# Patient Record
Sex: Female | Born: 1990 | Race: Black or African American | Hispanic: No | Marital: Single | Smoking: Never smoker
Health system: Southern US, Community
[De-identification: ages and names within clinical notes are randomized; demographics above are authoritative.]

## PROBLEM LIST (undated history)

## (undated) DIAGNOSIS — Z789 Other specified health status: Secondary | ICD-10-CM

## (undated) HISTORY — PX: NO PAST SURGERIES: SHX2092

---

## 2010-09-17 NOTE — L&D Delivery Note (Signed)
Delivery Note At 5:34 PM a viable female was delivered via  (Presentation:LOA ;  ).  APGAR: , ; weight .   Placenta status:Spont , .  Cord:3VC  with the following complications:none .   Anesthesia:  epidural Episiotomy: noneLacerations:  Suture Repair: none Est. Blood Loss400(mL):   Mom to postpartum.  Baby to nursery-stable.  Becky Sharp 06/17/2011, 5:46 PM

## 2010-12-27 LAB — RUBELLA ANTIBODY, IGM: Rubella: IMMUNE

## 2010-12-27 LAB — HEPATITIS B SURFACE ANTIGEN: Hepatitis B Surface Ag: NEGATIVE

## 2010-12-27 LAB — HIV ANTIBODY (ROUTINE TESTING W REFLEX): HIV: NONREACTIVE

## 2010-12-27 LAB — ANTIBODY SCREEN: Antibody Screen: NEGATIVE

## 2010-12-27 LAB — ABO/RH: RH Type: NEGATIVE

## 2011-06-13 ENCOUNTER — Other Ambulatory Visit: Payer: Self-pay | Admitting: Physician Assistant

## 2011-06-15 ENCOUNTER — Telehealth (HOSPITAL_COMMUNITY): Payer: Self-pay | Admitting: *Deleted

## 2011-06-15 ENCOUNTER — Encounter (HOSPITAL_COMMUNITY): Payer: Self-pay | Admitting: *Deleted

## 2011-06-15 NOTE — Telephone Encounter (Signed)
Preadmission screen  

## 2011-06-17 ENCOUNTER — Encounter (HOSPITAL_COMMUNITY): Payer: Self-pay | Admitting: Anesthesiology

## 2011-06-17 ENCOUNTER — Encounter (HOSPITAL_COMMUNITY): Payer: Self-pay | Admitting: *Deleted

## 2011-06-17 ENCOUNTER — Inpatient Hospital Stay (HOSPITAL_COMMUNITY): Payer: Medicaid Other | Admitting: Anesthesiology

## 2011-06-17 ENCOUNTER — Inpatient Hospital Stay (HOSPITAL_COMMUNITY): Payer: Medicaid Other

## 2011-06-17 ENCOUNTER — Inpatient Hospital Stay (HOSPITAL_COMMUNITY)
Admission: AD | Admit: 2011-06-17 | Discharge: 2011-06-19 | DRG: 775 | Disposition: A | Discharge status: Home / Self Care | Payer: Medicaid Other | Source: Ambulatory Visit | Attending: Obstetrics & Gynecology | Admitting: Obstetrics & Gynecology

## 2011-06-17 DIAGNOSIS — O480 Post-term pregnancy: Secondary | ICD-10-CM

## 2011-06-17 HISTORY — DX: Other specified health status: Z78.9

## 2011-06-17 LAB — RPR: RPR Ser Ql: NONREACTIVE

## 2011-06-17 LAB — CBC
MCH: 31.5 pg (ref 26.0–34.0)
MCV: 94 fL (ref 78.0–100.0)
Platelets: 142 10*3/uL — ABNORMAL LOW (ref 150–400)
RBC: 3.49 MIL/uL — ABNORMAL LOW (ref 3.87–5.11)

## 2011-06-17 LAB — URINALYSIS, ROUTINE W REFLEX MICROSCOPIC
Bilirubin Urine: NEGATIVE
Specific Gravity, Urine: <1.005 — ABNORMAL LOW (ref 1.005–1.030)
Urobilinogen, UA: 0.2 mg/dL (ref 0.0–1.0)

## 2011-06-17 LAB — COMPREHENSIVE METABOLIC PANEL
AST: 18 U/L (ref 0–37)
BUN: 11 mg/dL (ref 6–23)
CO2: 22 mEq/L (ref 19–32)
Calcium: 9.7 mg/dL (ref 8.4–10.5)
Creatinine, Ser: 0.7 mg/dL (ref 0.50–1.10)
GFR calc Af Amer: >60 mL/min (ref >60)
GFR calc non Af Amer: >60 mL/min (ref >60)
Glucose, Bld: 90 mg/dL (ref 70–99)

## 2011-06-17 LAB — PROTEIN / CREATININE RATIO, URINE
Creatinine, Urine: 90.47 mg/dL
Protein Creatinine Ratio: 0.13 (ref 0.00–0.15)
Total Protein, Urine: 11.6 mg/dL

## 2011-06-17 MED ORDER — ZOLPIDEM TARTRATE 5 MG PO TABS: 5.0000 mg | ORAL_TABLET | Freq: Every evening | ORAL | Status: DC | PRN

## 2011-06-17 MED ORDER — TETANUS-DIPHTH-ACELL PERTUSSIS 5-2.5-18.5 LF-MCG/0.5 IM SUSP
0.5000 mL | Freq: Once | INTRAMUSCULAR | Status: AC
  Administered 2011-06-18: 0.5 mL via INTRAMUSCULAR
  Filled 2011-06-17: qty 0.5

## 2011-06-17 MED ORDER — PHENYLEPHRINE 40 MCG/ML (10ML) SYRINGE FOR IV PUSH (FOR BLOOD PRESSURE SUPPORT)
80.0000 ug | PREFILLED_SYRINGE | INTRAVENOUS | Status: DC | PRN
  Filled 2011-06-17 (×2): qty 5

## 2011-06-17 MED ORDER — EPHEDRINE 5 MG/ML INJ
10.0000 mg | INTRAVENOUS | Status: DC | PRN
  Filled 2011-06-17: qty 4

## 2011-06-17 MED ORDER — FLEET ENEMA 7-19 GM/118ML RE ENEM: 1.0000 | ENEMA | RECTAL | Status: DC | PRN

## 2011-06-17 MED ORDER — OXYTOCIN 20 UNITS IN LACTATED RINGERS INFUSION - SIMPLE: 125.0000 mL/h | Freq: Once | INTRAVENOUS | Status: DC

## 2011-06-17 MED ORDER — OXYTOCIN BOLUS FROM INFUSION
500.0000 mL | Freq: Once | INTRAVENOUS | Status: DC
  Filled 2011-06-17: qty 500

## 2011-06-17 MED ORDER — SIMETHICONE 80 MG PO CHEW: 80.0000 mg | CHEWABLE_TABLET | ORAL | Status: DC | PRN

## 2011-06-17 MED ORDER — LACTATED RINGERS IV SOLN
500.0000 mL | Freq: Once | INTRAVENOUS | Status: AC
  Administered 2011-06-17: 500 mL via INTRAVENOUS

## 2011-06-17 MED ORDER — LACTATED RINGERS IV SOLN
INTRAVENOUS | Status: DC
  Administered 2011-06-17: 12:00:00 via INTRAVENOUS

## 2011-06-17 MED ORDER — PRENATAL PLUS 27-1 MG PO TABS
1.0000 | ORAL_TABLET | Freq: Every day | ORAL | Status: DC
  Administered 2011-06-18 – 2011-06-19 (×2): 1 via ORAL
  Filled 2011-06-17 (×2): qty 1

## 2011-06-17 MED ORDER — EPHEDRINE 5 MG/ML INJ
10.0000 mg | INTRAVENOUS | Status: DC | PRN
  Filled 2011-06-17 (×2): qty 4

## 2011-06-17 MED ORDER — IBUPROFEN 600 MG PO TABS
600.0000 mg | ORAL_TABLET | Freq: Four times a day (QID) | ORAL | Status: DC
  Administered 2011-06-17 – 2011-06-19 (×8): 600 mg via ORAL
  Filled 2011-06-17 (×8): qty 1

## 2011-06-17 MED ORDER — LIDOCAINE HCL 1.5 % IJ SOLN
INTRAMUSCULAR | Status: DC | PRN
  Administered 2011-06-17 (×2): 5 mL via EPIDURAL

## 2011-06-17 MED ORDER — IBUPROFEN 600 MG PO TABS: 600.0000 mg | ORAL_TABLET | Freq: Four times a day (QID) | ORAL | Status: DC | PRN

## 2011-06-17 MED ORDER — DIPHENHYDRAMINE HCL 25 MG PO CAPS: 25.0000 mg | ORAL_CAPSULE | Freq: Four times a day (QID) | ORAL | Status: DC | PRN

## 2011-06-17 MED ORDER — WITCH HAZEL-GLYCERIN EX PADS: 1.0000 "application " | MEDICATED_PAD | CUTANEOUS | Status: DC | PRN

## 2011-06-17 MED ORDER — DIPHENHYDRAMINE HCL 50 MG/ML IJ SOLN: 12.5000 mg | INTRAMUSCULAR | Status: DC | PRN

## 2011-06-17 MED ORDER — FENTANYL 2.5 MCG/ML BUPIVACAINE 1/10 % EPIDURAL INFUSION (WH - ANES)
14.0000 mL/h | INTRAMUSCULAR | Status: DC
  Administered 2011-06-17: 14 mL/h via EPIDURAL
  Filled 2011-06-17 (×2): qty 60

## 2011-06-17 MED ORDER — MISOPROSTOL 200 MCG PO TABS
ORAL_TABLET | ORAL | Status: AC
  Filled 2011-06-17: qty 5

## 2011-06-17 MED ORDER — FENTANYL 2.5 MCG/ML BUPIVACAINE 1/10 % EPIDURAL INFUSION (WH - ANES)
INTRAMUSCULAR | Status: DC | PRN
  Administered 2011-06-17: 14 mL/h via EPIDURAL

## 2011-06-17 MED ORDER — MISOPROSTOL 200 MCG PO TABS
1000.0000 ug | ORAL_TABLET | Freq: Once | ORAL | Status: AC
  Administered 2011-06-17: 1000 ug via RECTAL

## 2011-06-17 MED ORDER — BENZOCAINE-MENTHOL 20-0.5 % EX AERO: 1.0000 "application " | INHALATION_SPRAY | CUTANEOUS | Status: DC | PRN

## 2011-06-17 MED ORDER — OXYCODONE-ACETAMINOPHEN 5-325 MG PO TABS
1.0000 | ORAL_TABLET | ORAL | Status: DC | PRN
  Administered 2011-06-18 – 2011-06-19 (×2): 1 via ORAL
  Filled 2011-06-17 (×2): qty 1

## 2011-06-17 MED ORDER — TERBUTALINE SULFATE 1 MG/ML IJ SOLN: 0.2500 mg | Freq: Once | INTRAMUSCULAR | Status: DC | PRN

## 2011-06-17 MED ORDER — OXYTOCIN 20 UNITS IN LACTATED RINGERS INFUSION - SIMPLE
1.0000 m[IU]/min | INTRAVENOUS | Status: DC
  Administered 2011-06-17: 2 m[IU]/min via INTRAVENOUS
  Filled 2011-06-17: qty 1000

## 2011-06-17 MED ORDER — CITRIC ACID-SODIUM CITRATE 334-500 MG/5ML PO SOLN: 30.0000 mL | ORAL | Status: DC | PRN

## 2011-06-17 MED ORDER — PHENYLEPHRINE 40 MCG/ML (10ML) SYRINGE FOR IV PUSH (FOR BLOOD PRESSURE SUPPORT)
80.0000 ug | PREFILLED_SYRINGE | INTRAVENOUS | Status: DC | PRN
  Filled 2011-06-17: qty 5

## 2011-06-17 MED ORDER — ONDANSETRON HCL 4 MG/2ML IJ SOLN: 4.0000 mg | INTRAMUSCULAR | Status: DC | PRN

## 2011-06-17 MED ORDER — LACTATED RINGERS IV SOLN
500.0000 mL | INTRAVENOUS | Status: DC | PRN
  Administered 2011-06-17: 1000 mL via INTRAVENOUS

## 2011-06-17 MED ORDER — ONDANSETRON HCL 4 MG PO TABS: 4.0000 mg | ORAL_TABLET | ORAL | Status: DC | PRN

## 2011-06-17 MED ORDER — LANOLIN HYDROUS EX OINT: TOPICAL_OINTMENT | CUTANEOUS | Status: DC | PRN

## 2011-06-17 MED ORDER — OXYCODONE-ACETAMINOPHEN 5-325 MG PO TABS: 2.0000 | ORAL_TABLET | ORAL | Status: DC | PRN

## 2011-06-17 MED ORDER — DIBUCAINE 1 % RE OINT: 1.0000 "application " | TOPICAL_OINTMENT | RECTAL | Status: DC | PRN

## 2011-06-17 MED ORDER — ONDANSETRON HCL 4 MG/2ML IJ SOLN: 4.0000 mg | Freq: Four times a day (QID) | INTRAMUSCULAR | Status: DC | PRN

## 2011-06-17 MED ORDER — LIDOCAINE HCL (PF) 1 % IJ SOLN: 30.0000 mL | INTRAMUSCULAR | Status: DC | PRN

## 2011-06-17 MED ORDER — SENNOSIDES-DOCUSATE SODIUM 8.6-50 MG PO TABS
2.0000 | ORAL_TABLET | Freq: Every day | ORAL | Status: DC
  Administered 2011-06-18: 2 via ORAL

## 2011-06-17 MED ORDER — ACETAMINOPHEN 325 MG PO TABS: 650.0000 mg | ORAL_TABLET | ORAL | Status: DC | PRN

## 2011-06-17 NOTE — Anesthesia Postprocedure Evaluation (Signed)
Anesthesia Post Note  Patient: Becky Sharp  Procedure(s) Performed: * No procedures listed *  Anesthesia type: Epidural  Patient location: Mother/Baby  Post pain: Pain level controlled  Post assessment: Post-op Vital signs reviewed  Last Vitals:  Filed Vitals:   06/17/11 1816  BP: 145/80  Pulse: 104  Temp:   Resp:     Post vital signs: Reviewed  Level of consciousness: awake  Complications: No apparent anesthesia complications

## 2011-06-17 NOTE — Anesthesia Procedure Notes (Signed)
Epidural Patient location during procedure: OB Start time: 06/17/2011 11:55 AM End time: 06/17/2011 12:02 PM Reason for block: procedure for pain  Staffing Anesthesiologist: Sandrea Hughs Performed by: anesthesiologist   Preanesthetic Checklist Completed: patient identified, site marked, surgical consent, pre-op evaluation, timeout performed, IV checked, risks and benefits discussed and monitors and equipment checked  Epidural Patient position: sitting Prep: site prepped and draped and DuraPrep Patient monitoring: continuous pulse ox and blood pressure Approach: midline Injection technique: LOR air  Needle:  Needle type: Tuohy  Needle gauge: 17 G Needle length: 9 cm Needle insertion depth: 5 cm cm Catheter type: closed end flexible Catheter size: 19 Gauge Catheter at skin depth: 10 cm Test dose: negative and 1.5% lidocaine  Assessment Sensory level: T8 Events: blood not aspirated, injection not painful, no injection resistance, negative IV test and no paresthesia

## 2011-06-17 NOTE — H&P (Signed)
Becky Sharp is a 20 y.o. G1P0 female at [redacted]w[redacted]d presenting for contractions since midnight.  Every 3-4 minutes, rated a 6/10.  Also leaking brownish liquid since just after midnight.  Denies vaginal bleeding.  Reports good fetal movement.  Prenatal care at [facility].  Scheduled for induction on Wednesday. Maternal Medical History:  Reason for admission: Reason for admission: contractions.  Reason for Admission:   nauseaContractions: Onset was 3-5 hours ago.   Frequency: regular.   Duration is approximately 60 seconds.   Perceived severity is moderate.    Fetal activity: Perceived fetal activity is normal.   Last perceived fetal movement was within the past hour.    Prenatal complications: no prenatal complications Prenatal Complications - Diabetes: none.    OB History    Grav Para Term Preterm Abortions TAB SAB Ect Mult Living   1              Past Medical History  Diagnosis Date  . No pertinent past medical history    Past Surgical History  Procedure Date  . No past surgeries    Family History: family history includes Arthritis in her maternal grandmother and paternal grandmother; Asthma in her paternal grandfather; Diabetes in her maternal grandmother, paternal grandfather, and paternal grandmother; Heart disease in her paternal grandfather; Hyperlipidemia in her paternal grandmother; Hypertension in her mother; Stroke in her paternal grandfather; and Vision loss in her maternal grandmother. Social History:  reports that she has never smoked. She does not have any smokeless tobacco history on file. She reports that she does not drink alcohol or use illicit drugs.  No Known Allergies  No current facility-administered medications on file prior to encounter.   No current outpatient prescriptions on file prior to encounter.  Prenatals  Review of Systems  Constitutional: Negative for fever and chills.  Eyes: Negative for blurred vision.  Respiratory: Negative for shortness of breath.    Cardiovascular: Negative for palpitations.  Gastrointestinal: Negative for nausea, vomiting and diarrhea.  Genitourinary: Negative for dysuria.  Skin: Negative for rash.  Neurological: Negative for dizziness, tingling and headaches.    Dilation: 3 Effacement (%): 50 Station: -2 Exam by:: Dr Elwyn Reach Blood pressure 126/93, pulse 93, temperature 98.6 F (37 C), temperature source Oral, resp. rate 20, last menstrual period 09/03/2010. Maternal Exam:  Uterine Assessment: Contraction duration is 70 seconds. Contraction frequency is regular.  Contractions every 2-5 minutes  Abdomen: Patient reports no abdominal tenderness. Fundal height is consistent with dates.   Estimated fetal weight is 7.5lb.   Fetal presentation: vertex  Introitus: Normal vulva. Normal vagina.  Ferning test: negative.   Pelvis: adequate for delivery.   Cervix: Cervix evaluated by digital exam.     Fetal Exam Fetal Monitor Review: Mode: ultrasound.   Baseline rate: 145.  Variability: minimal (<5 bpm).   Pattern: accelerations present and no decelerations.    Fetal State Assessment: Category II - tracings are indeterminate.     Physical Exam  Constitutional: She is oriented to person, place, and time. She appears well-developed and well-nourished. No distress.  HENT:  Head: Normocephalic and atraumatic.  Mouth/Throat: Oropharynx is clear and moist.  Eyes: No scleral icterus.  Neck: Normal range of motion.  Respiratory: Effort normal.  GI:       gravid  Musculoskeletal: She exhibits no edema and no tenderness.  Neurological: She is alert and oriented to person, place, and time.  Skin: Skin is warm and dry. No rash noted.  Pelvic: normal external genitalia.  Normal vagina.  Pooling positive with brownish fluid.  Filed Vitals:   06/17/11 0322  BP: 146/92  Pulse: 93  Temp:   Resp:      Prenatal labs: ABO, Rh: A/Negative/-- (04/11 0000) Antibody: Negative (04/11 0000) Rubella: Immune (04/11  0000) RPR: Nonreactive (04/11 0000)  HBsAg: Negative (04/11 0000)  HIV: Non-reactive (04/11 0000)  GBS: Negative (09/06 0000)   Assessment/Plan: 20 year old G1 at 41 weeks presenting for labor evaluation. Rh neg, rubella immune, GBS neg Fetal wellbeing: moderate variability after crackers and sprite; no accels  Will walk for an hour then recheck. Cervical exam unchanged, however BPs borderline and fetus not reactive.  Will get PIH labs and BPP.   CBC    Component Value Date/Time   WBC 10.3 06/17/2011 0401   RBC 3.49* 06/17/2011 0401   HGB 11.0* 06/17/2011 0401   HCT 32.8* 06/17/2011 0401   PLT 142* 06/17/2011 0401   MCV 94.0 06/17/2011 0401   MCH 31.5 06/17/2011 0401   MCHC 33.5 06/17/2011 0401   RDW 13.4 06/17/2011 0401   CMP     Component Value Date/Time   NA 135 06/17/2011 0401   K 3.9 06/17/2011 0401   CL 104 06/17/2011 0401   CO2 22 06/17/2011 0401   GLUCOSE 90 06/17/2011 0401   BUN 11 06/17/2011 0401   CREATININE 0.70 06/17/2011 0401   CALCIUM 9.7 06/17/2011 0401   PROT 6.3 06/17/2011 0401   ALBUMIN 2.7* 06/17/2011 0401   AST 18 06/17/2011 0401   ALT 10 06/17/2011 0401   ALKPHOS 225* 06/17/2011 0401   BILITOT 0.3 06/17/2011 0401   GFRNONAA >60 06/17/2011 0401   GFRAA >60 06/17/2011 0401   Urine dipstick shows positive for leukocytes, negative for protein.  Micro exam: few squames, few bacteria Urine Pr/Cr: pending   BPP: 8/8, subjectively low AFI, AFI 8.11cm (16%ile)  Amnisure: negative Dilation: 3.5 Effacement (%): 70 Cervical Position: Middle Station: -3 Presentation: Vertex Exam by:: Dr Elwyn Reach  Will admit to labor and delivery as is making some change, has low-normal AFI with non-reactive strip, and is scheduled for induction in 3 days.  BOOTH, Munir Victorian 06/17/2011, 2:46 AM

## 2011-06-17 NOTE — ED Notes (Signed)
Dr Elwyn Reach notified lab and u/s results back and will see pt

## 2011-06-17 NOTE — ED Notes (Signed)
0235 Sprite and crackers to pt per request Dr Elwyn Reach

## 2011-06-17 NOTE — ED Notes (Signed)
Dr Elwyn Reach notified of pt's admission and status. Will see pt

## 2011-06-17 NOTE — ED Provider Notes (Signed)
Agree with note above. MUHAMMAD,Chanie Soucek 

## 2011-06-17 NOTE — Anesthesia Preprocedure Evaluation (Signed)
Anesthesia Evaluation  Name, MR# and DOB Patient awake  General Assessment Comment  Reviewed: Allergy & Precautions, H&P , Patient's Chart, lab work & pertinent test results  Airway Mallampati: I TM Distance: >3 FB Neck ROM: full    Dental No notable dental hx.    Pulmonary  clear to auscultation  Pulmonary exam normal       Cardiovascular     Neuro/Psych Negative Neurological ROS  Negative Psych ROS   GI/Hepatic negative GI ROS Neg liver ROS    Endo/Other  Negative Endocrine ROS  Renal/GU negative Renal ROS  Genitourinary negative   Musculoskeletal negative musculoskeletal ROS (+)   Abdominal Normal abdominal exam  (+)   Peds  Hematology negative hematology ROS (+)   Anesthesia Other Findings   Reproductive/Obstetrics (+) Pregnancy                           Anesthesia Physical Anesthesia Plan  ASA: II  Anesthesia Plan: Epidural   Post-op Pain Management:    Induction:   Airway Management Planned:   Additional Equipment:   Intra-op Plan:   Post-operative Plan:   Informed Consent: I have reviewed the patients History and Physical, chart, labs and discussed the procedure including the risks, benefits and alternatives for the proposed anesthesia with the patient or authorized representative who has indicated his/her understanding and acceptance.     Plan Discussed with:   Anesthesia Plan Comments:         Anesthesia Quick Evaluation

## 2011-06-17 NOTE — ED Provider Notes (Signed)
Becky Sharp is a 20 y.o. G1P0 female at [redacted]w[redacted]d presenting for contractions since midnight.  Every 3-4 minutes, rated a 6/10.  Also leaking brownish liquid since just after midnight.  Denies vaginal bleeding.  Reports good fetal movement.  Prenatal care at [facility].  Scheduled for induction on Wednesday. Maternal Medical History:  Reason for admission: Reason for admission: contractions.  Reason for Admission:   nauseaContractions: Onset was 3-5 hours ago.   Frequency: regular.   Duration is approximately 60 seconds.   Perceived severity is moderate.    Fetal activity: Perceived fetal activity is normal.   Last perceived fetal movement was within the past hour.    Prenatal complications: no prenatal complications Prenatal Complications - Diabetes: none.    OB History    Grav Para Term Preterm Abortions TAB SAB Ect Mult Living   1              Past Medical History  Diagnosis Date  . No pertinent past medical history    Past Surgical History  Procedure Date  . No past surgeries    Family History: family history includes Arthritis in her maternal grandmother and paternal grandmother; Asthma in her paternal grandfather; Diabetes in her maternal grandmother, paternal grandfather, and paternal grandmother; Heart disease in her paternal grandfather; Hyperlipidemia in her paternal grandmother; Hypertension in her mother; Stroke in her paternal grandfather; and Vision loss in her maternal grandmother. Social History:  reports that she has never smoked. She does not have any smokeless tobacco history on file. She reports that she does not drink alcohol or use illicit drugs.  No Known Allergies  No current facility-administered medications on file prior to encounter.   No current outpatient prescriptions on file prior to encounter.  Prenatals  Review of Systems  Constitutional: Negative for fever and chills.  Eyes: Negative for blurred vision.  Respiratory: Negative for shortness of breath.     Cardiovascular: Negative for palpitations.  Gastrointestinal: Negative for nausea, vomiting and diarrhea.  Genitourinary: Negative for dysuria.  Skin: Negative for rash.  Neurological: Negative for dizziness, tingling and headaches.    Dilation: 3 Effacement (%): 50 Station: -2 Exam by:: Dr Elwyn Reach Blood pressure 126/93, pulse 93, temperature 98.6 F (37 C), temperature source Oral, resp. rate 20, last menstrual period 09/03/2010. Maternal Exam:  Uterine Assessment: Contraction duration is 70 seconds. Contraction frequency is regular.  Contractions every 2-5 minutes  Abdomen: Patient reports no abdominal tenderness. Fundal height is consistent with dates.   Estimated fetal weight is 7.5lb.   Fetal presentation: vertex  Introitus: Normal vulva. Normal vagina.  Ferning test: negative.   Pelvis: adequate for delivery.   Cervix: Cervix evaluated by digital exam.     Fetal Exam Fetal Monitor Review: Mode: ultrasound.   Baseline rate: 145.  Variability: minimal (<5 bpm).   Pattern: accelerations present and no decelerations.    Fetal State Assessment: Category II - tracings are indeterminate.     Physical Exam  Constitutional: She is oriented to person, place, and time. She appears well-developed and well-nourished. No distress.  HENT:  Head: Normocephalic and atraumatic.  Mouth/Throat: Oropharynx is clear and moist.  Eyes: No scleral icterus.  Neck: Normal range of motion.  Respiratory: Effort normal.  GI:       gravid  Musculoskeletal: She exhibits no edema and no tenderness.  Neurological: She is alert and oriented to person, place, and time.  Skin: Skin is warm and dry. No rash noted.  Pelvic: normal external genitalia.  Normal vagina.  Pooling positive with brownish fluid.  Filed Vitals:   06/17/11 0322  BP: 146/92  Pulse: 93  Temp:   Resp:      Prenatal labs: ABO, Rh: A/Negative/-- (04/11 0000) Antibody: Negative (04/11 0000) Rubella: Immune (04/11  0000) RPR: Nonreactive (04/11 0000)  HBsAg: Negative (04/11 0000)  HIV: Non-reactive (04/11 0000)  GBS: Negative (09/06 0000)   Assessment/Plan: 20 year old G1 at 41 weeks presenting for labor evaluation. Rh neg, rubella immune, GBS neg Fetal wellbeing: moderate variability after crackers and sprite; no accels  Will walk for an hour then recheck. Cervical exam unchanged, however BPs borderline and fetus not reactive.  Will get PIH labs and BPP.   CBC    Component Value Date/Time   WBC 10.3 06/17/2011 0401   RBC 3.49* 06/17/2011 0401   HGB 11.0* 06/17/2011 0401   HCT 32.8* 06/17/2011 0401   PLT 142* 06/17/2011 0401   MCV 94.0 06/17/2011 0401   MCH 31.5 06/17/2011 0401   MCHC 33.5 06/17/2011 0401   RDW 13.4 06/17/2011 0401   CMP     Component Value Date/Time   NA 135 06/17/2011 0401   K 3.9 06/17/2011 0401   CL 104 06/17/2011 0401   CO2 22 06/17/2011 0401   GLUCOSE 90 06/17/2011 0401   BUN 11 06/17/2011 0401   CREATININE 0.70 06/17/2011 0401   CALCIUM 9.7 06/17/2011 0401   PROT 6.3 06/17/2011 0401   ALBUMIN 2.7* 06/17/2011 0401   AST 18 06/17/2011 0401   ALT 10 06/17/2011 0401   ALKPHOS 225* 06/17/2011 0401   BILITOT 0.3 06/17/2011 0401   GFRNONAA >60 06/17/2011 0401   GFRAA >60 06/17/2011 0401   Urine dipstick shows positive for leukocytes, negative for protein.  Micro exam: few squames, few bacteria Urine Pr/Cr: pending   BPP: 8/8, subjectively low AFI, AFI 8.11cm (16%ile)  Amnisure: negative Dilation: 3.5 Effacement (%): 70 Cervical Position: Middle Station: -3 Presentation: Vertex Exam by:: Dr Elwyn Reach  Will admit to labor and delivery as is making some change and has low-normal AFI with non-reactive strip.  BOOTH, Farra Nikolic 06/17/2011, 2:46 AM

## 2011-06-17 NOTE — Progress Notes (Signed)
Report called to Jodi RN in BS. Pt to BS via w/c 

## 2011-06-17 NOTE — Progress Notes (Signed)
Coy Vandoren is a 20 y.o. G1P0 at [redacted]w[redacted]d by ultrasound admitted for early labor  Subjective:   Objective: BP 139/80  Pulse 84  Temp(Src) 98.2 F (36.8 C) (Oral)  Resp 18  Ht 5\' 3"  (1.6 m)  Wt 64.411 kg (142 lb)  BMI 25.15 kg/m2  LMP 09/03/2010      FHT:  FHR: 140 bpm, variability: moderate,  accelerations:  Present,  decelerations:  Absent UC:   irregular, every 2-4 minutes SVE:   Dilation: 3.5 Effacement (%): 70 Station: -3 Exam by:: Anothony Bursch, cnm  Labs: Lab Results  Component Value Date   WBC 10.3 06/17/2011   HGB 11.0* 06/17/2011   HCT 32.8* 06/17/2011   MCV 94.0 06/17/2011   PLT 142* 06/17/2011    Assessment / Plan: Augmentation of labor, progressing well  Labor:  Preeclampsia:  no signs or symptoms of toxicity Fetal Wellbeing:  Category I Pain Control:  Labor support without medications I/D:  n/a Anticipated MOD:  NSVD  [name] 06/17/2011, 11:41 AM

## 2011-06-17 NOTE — Progress Notes (Signed)
Dr Elwyn Reach in and [place] obtained. Pt tol well. Cont to have brown d/c

## 2011-06-17 NOTE — Progress Notes (Signed)
To us via wc.

## 2011-06-17 NOTE — Progress Notes (Signed)
Becky Sharp is a 20 y.o. G1P0 at [redacted]w[redacted]d by ultrasound admitted for early labor  Subjective:   Objective: BP 135/80  Pulse 85  Temp(Src) 98.8 F (37.1 C) (Oral)  Resp 20  Ht 5\' 3"  (1.6 m)  Wt 64.411 kg (142 lb)  BMI 25.15 kg/m2  LMP 09/03/2010      FHT:  FHR: 140 bpm, variability: moderate,  accelerations:  Present,  decelerations:  Absent UC:   regular, every 5 minutes SVE:   Dilation: 3.5 Effacement (%): 70 Station: -3 Exam by:: Dr Elwyn Reach  Labs: Lab Results  Component Value Date   WBC 10.3 06/17/2011   HGB 11.0* 06/17/2011   HCT 32.8* 06/17/2011   MCV 94.0 06/17/2011   PLT 142* 06/17/2011    Assessment / Plan: Inadequate uc's will start pit aug.  Labor:  Preeclampsia:  no signs or symptoms of toxicity Fetal Wellbeing:  Category I Pain Control:  Labor support without medications I/D:  n/a Anticipated MOD:  NSVD  [name] 06/17/2011, 9:19 AM

## 2011-06-17 NOTE — Progress Notes (Signed)
Pt states, " I was laying down at midnight, and felt a trickle of water; I got up and moved around and some more came out. I went to the bathroom and saw some blood in my panties."

## 2011-06-17 NOTE — ED Notes (Signed)
Dr Elwyn Reach in to see pt. EFm strip reviewed. Spec exam done and fern slide prepared to r/o srom. Brownish watery d/c.

## 2011-06-17 NOTE — Progress Notes (Signed)
Pt returned from u/s. States had diarrhea in u/s. Cont to leak brownish-pink mucousy d/c.

## 2011-06-17 NOTE — Progress Notes (Signed)
G1 at 41wks. Has leaked small amt fld couple times since 2400. Ctxs started about that time. Fld is light brown in color.

## 2011-06-17 NOTE — Progress Notes (Signed)
Becky Sharp is a 20 y.o. G1P0 at [redacted]w[redacted]d by ultrasound admitted for rupture of membranes  Subjective:   Objective: BP 137/79  Pulse 99  Temp(Src) 98 F (36.7 C) (Oral)  Resp 20  Ht 5\' 3"  (1.6 m)  Wt 64.411 kg (142 lb)  BMI 25.15 kg/m2  SpO2 100%  LMP 09/03/2010      FHT:  FHR: 135-140 bpm, variability: moderate,  accelerations:  Present,  decelerations:  Absent UC:   regular, every 2-4 minutes SVE:   Dilation: 4 Effacement (%): 90 Station: 0 Exam by:: k fields, rn  Labs: Lab Results  Component Value Date   WBC 10.3 06/17/2011   HGB 11.0* 06/17/2011   HCT 32.8* 06/17/2011   MCV 94.0 06/17/2011   PLT 142* 06/17/2011    Assessment / Plan: Augmentation of labor, progressing well  Labor: Progressing normally Preeclampsia:  no signs or symptoms of toxicity Fetal Wellbeing:  Category I Pain Control:  Epidural I/D:  n/a Anticipated MOD:  NSVD  [name] 06/17/2011, 3:10 PM

## 2011-06-18 MED ORDER — RHO D IMMUNE GLOBULIN 1500 UNIT/2ML IJ SOLN
300.0000 ug | Freq: Once | INTRAMUSCULAR | Status: AC
Start: 2011-06-18 — End: 2011-06-18
  Administered 2011-06-18: 300 ug via INTRAMUSCULAR
  Filled 2011-06-18: qty 2

## 2011-06-18 MED ORDER — BENZOCAINE-MENTHOL 20-0.5 % EX AERO
INHALATION_SPRAY | CUTANEOUS | Status: AC
  Filled 2011-06-18: qty 56

## 2011-06-18 MED ORDER — INFLUENZA VIRUS VACC SPLIT PF IM SUSP
0.5000 mL | Freq: Once | INTRAMUSCULAR | Status: AC
  Administered 2011-06-18: 0.5 mL via INTRAMUSCULAR
  Filled 2011-06-18: qty 0.5

## 2011-06-18 NOTE — Progress Notes (Signed)
UR chart review completed.  

## 2011-06-18 NOTE — Progress Notes (Signed)
  Addendum  Pt is getting implanon post partum, not depo.   LEGGETT,KELLY H. 7:00 AM

## 2011-06-18 NOTE — Anesthesia Postprocedure Evaluation (Signed)
  Anesthesia Post-op Note  Patient: Becky Sharp  Procedure(s) Performed: * No procedures listed *  Patient Location: Mother/Baby  Anesthesia Type: Epidural  Level of Consciousness: awake, alert , oriented and patient cooperative  Airway and Oxygen Therapy: Patient Spontanous Breathing  Post-op Pain: none  Post-op Assessment: Post-op Vital signs reviewed, Patient's Cardiovascular Status Stable, Respiratory Function Stable, No signs of Nausea or vomiting, Adequate PO intake and Pain level controlled  Post-op Vital Signs: Reviewed and stable  Complications: No apparent anesthesia complications

## 2011-06-18 NOTE — Addendum Note (Signed)
Addendum  created 06/18/11 0807 by Suella Grove   Modules edited:Charges VN, Notes Section

## 2011-06-18 NOTE — Progress Notes (Signed)
Post Partum Day 1 Subjective: no complaints, up ad lib, voiding and tolerating PO  Objective: Blood pressure 124/79, pulse 97, temperature 98.4 F (36.9 C), temperature source Oral, resp. rate 16, height 5\' 3"  (1.6 m), weight 64.411 kg (142 lb), last menstrual period 09/03/2010, SpO2 99.00%, unknown if currently breastfeeding.  Physical Exam:  General: alert and cooperative Lochia: appropriate Uterine Fundus: firm Incision: n/a DVT Evaluation: No evidence of DVT seen on physical exam. Negative Homan's sign.   Basename 06/17/11 0401  HGB 11.0*  HCT 32.8*    Assessment/Plan: Plan for discharge tomorrow, Breastfeeding, Lactation consult and Contraception depo at office tdap at discharge  LOS: 1 day   Abryanna Musolino H. 06/18/2011, 6:47 AM

## 2011-06-19 LAB — RH IG WORKUP (INCLUDES ABO/RH)
Gestational Age(Wks): 41
Unit division: 0

## 2011-06-19 MED ORDER — IBUPROFEN 600 MG PO TABS: 600.0000 mg | ORAL_TABLET | Freq: Four times a day (QID) | ORAL | Status: AC

## 2011-06-19 NOTE — Discharge Summary (Signed)
Obstetric Discharge Summary Reason for Admission: onset of labor Prenatal Procedures: NST and ultrasound Intrapartum Procedures: spontaneous vaginal delivery Postpartum Procedures: none Complications-Operative and Postpartum: none Hemoglobin  Date Value Range Status  06/17/2011 11.0* 12.0-15.0 (g/dL) Final     HCT  Date Value Range Status  06/17/2011 32.8* 36.0-46.0 (%) Final    Discharge Diagnoses: Term Pregnancy-delivered  Discharge Information: Date: 06/19/2011 Activity: pelvic rest Diet: routine Medications: PNV and Ibuprofen Condition: stable Instructions: refer to practice specific booklet Discharge to: home   Newborn Data: Live born female  Birth Weight: 7 lb 3.7 oz (3280 g) APGAR: 9, 9  Home with mother.  Chantale Leugers E. 06/19/2011, 6:40 AM

## 2011-06-19 NOTE — Discharge Summary (Signed)
Agree with above note.  Becky Sharp 06/19/2011 8:00 AM   

## 2011-06-19 NOTE — Progress Notes (Signed)
Post Partum Day 2 Subjective: no complaints  Objective: Blood pressure 125/87, pulse 83, temperature 97.7 F (36.5 C), temperature source Oral, resp. rate 18, height 5\' 3"  (1.6 m), weight 64.411 kg (142 lb), last menstrual period 09/03/2010, SpO2 97.00%, unknown if currently breastfeeding.  Physical Exam:  General: alert, cooperative and no distress Lochia: appropriate Uterine Fundus: firm Incision: n/a DVT Evaluation: No evidence of DVT seen on physical exam. No cords or calf tenderness. No significant calf/ankle edema.   Basename 06/17/11 0401  HGB 11.0*  HCT 32.8*    Assessment/Plan: Discharge home, Breastfeeding and Contraception Implanon at 6 weeks  Has appt for circ in office with Dr. Despina Hidden   LOS: 2 days   Sayre Witherington E. 06/19/2011, 6:38 AM

## 2011-06-20 ENCOUNTER — Inpatient Hospital Stay (HOSPITAL_COMMUNITY): Admission: RE | Admit: 2011-06-20 | Payer: Self-pay | Source: Ambulatory Visit

## 2013-04-02 ENCOUNTER — Telehealth: Payer: Self-pay | Admitting: Obstetrics & Gynecology

## 2013-04-02 MED ORDER — MEGESTROL ACETATE 40 MG PO TABS: 40.0000 mg | ORAL_TABLET | Freq: Every day | ORAL | Status: AC

## 2013-04-02 NOTE — Telephone Encounter (Signed)
Spoke with pt. On Nexplanon since 07/2011. No bleeding x 1 year. Started bleeding in June. Not heavy but not light either. Can you prescribe med to help with this? Thanks!!!

## 2013-04-02 NOTE — Telephone Encounter (Signed)
Dr. Despina Hidden ordered Megace 40mg  for 1 month. Pt aware. JSY

## 2013-05-21 IMAGING — US US FETAL BPP W/O NONSTRESS
1 series · 14 of 23 positions shown · non-contrast
Comparison: none

[Series 1: us fetal bpp w/o nonstress · non-contrast · 23 acquisitions, 14 frames shown]
[im 1/23]
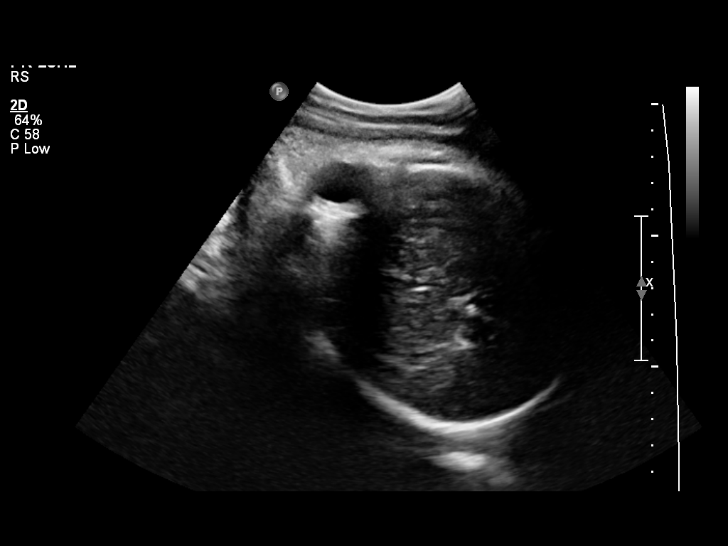
[im 3/23]
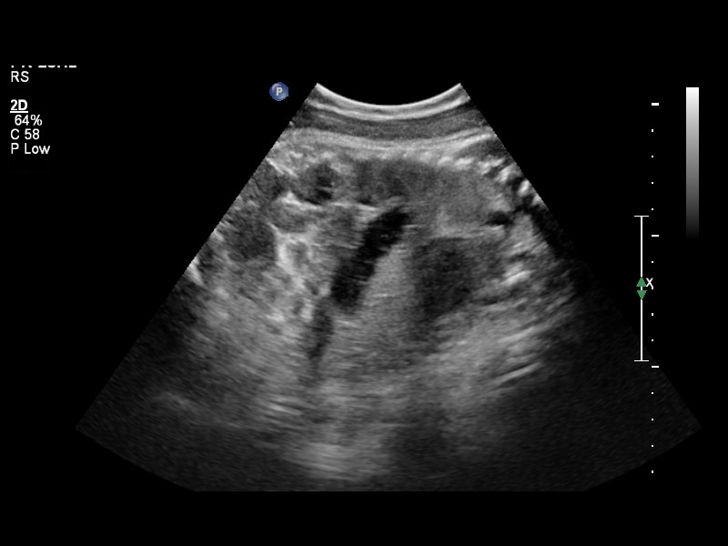
[im 5/23]
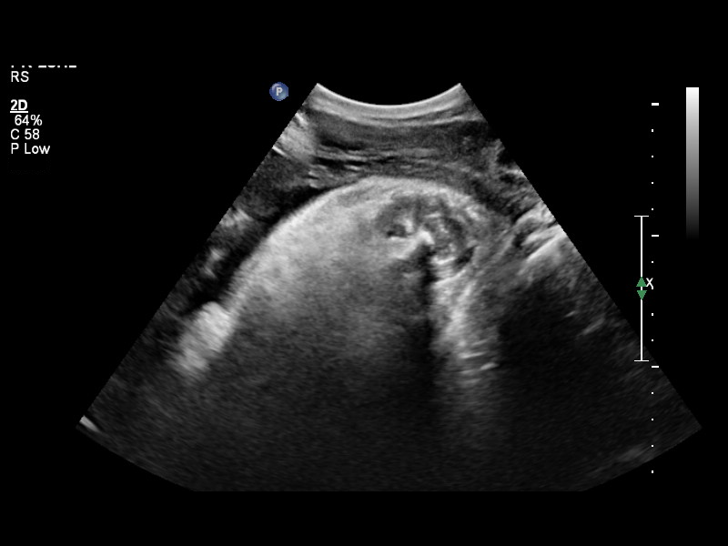
[im 6/23]
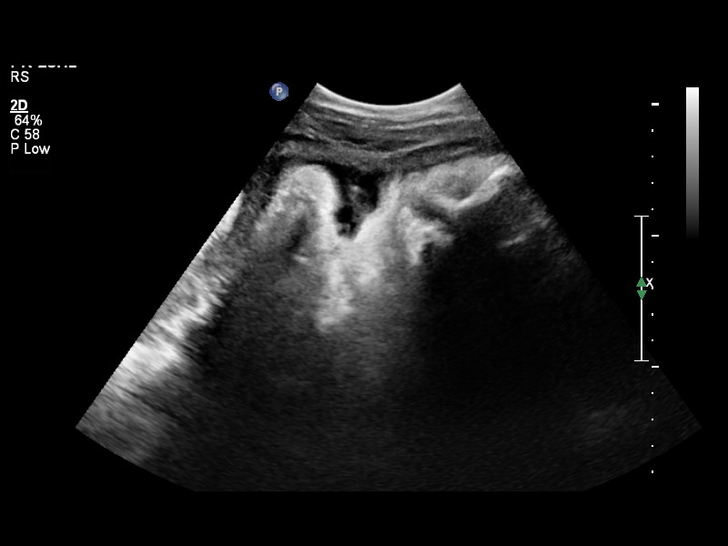
[im 8/23]
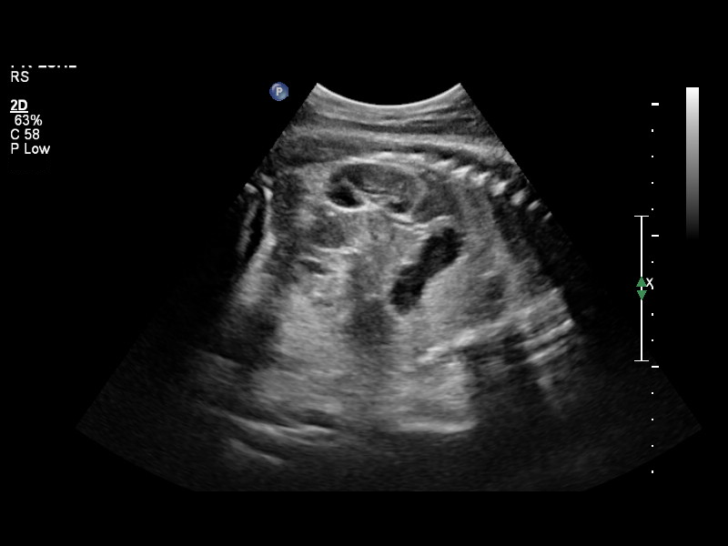
[im 10/23]
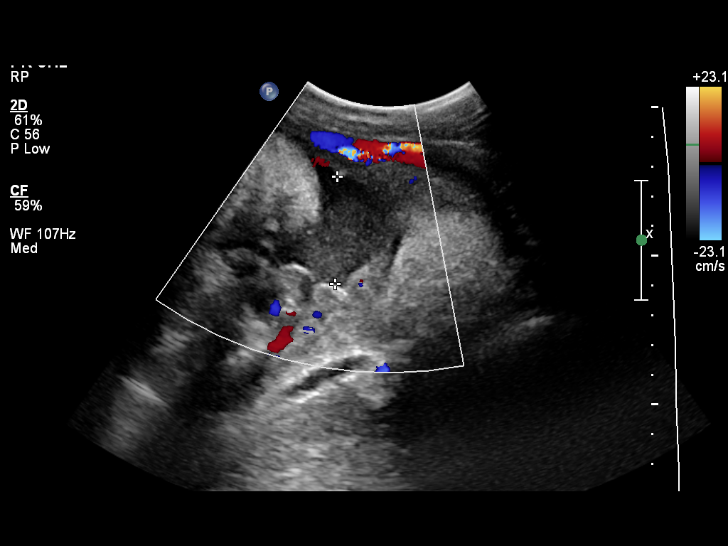
[im 11/23]
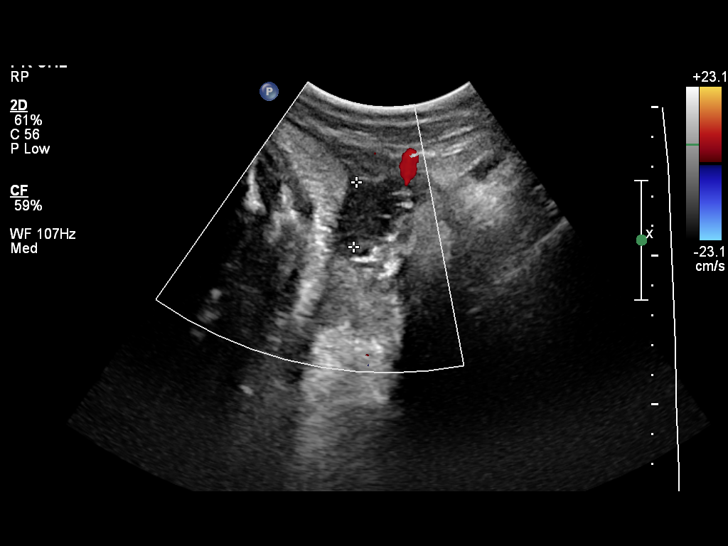
[im 13/23]
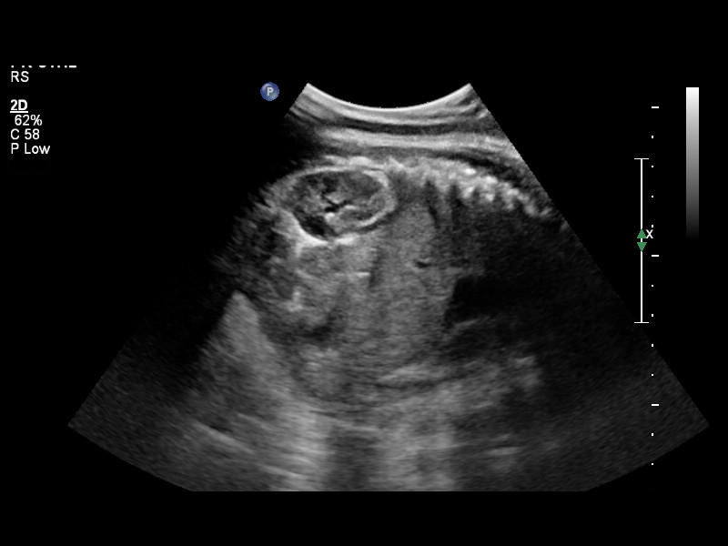
[im 14/23]
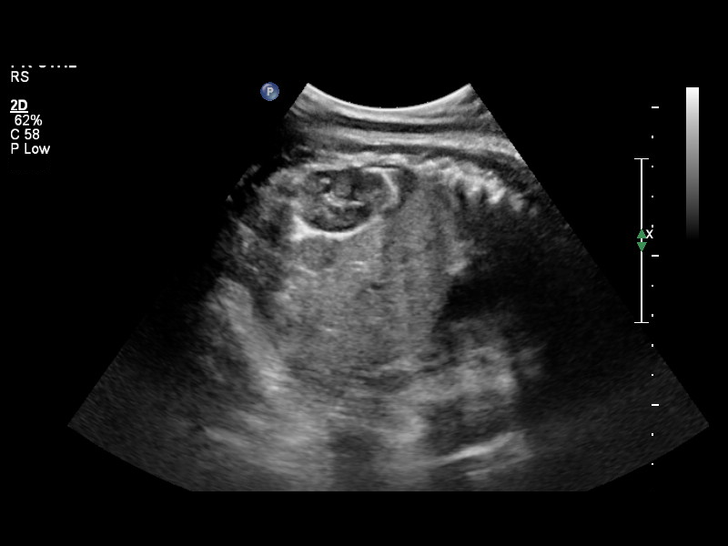
[im 16/23]
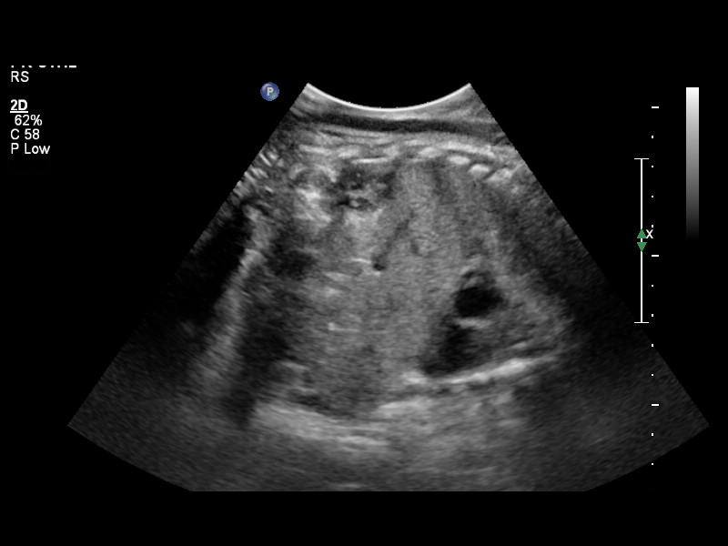
[im 18/23]
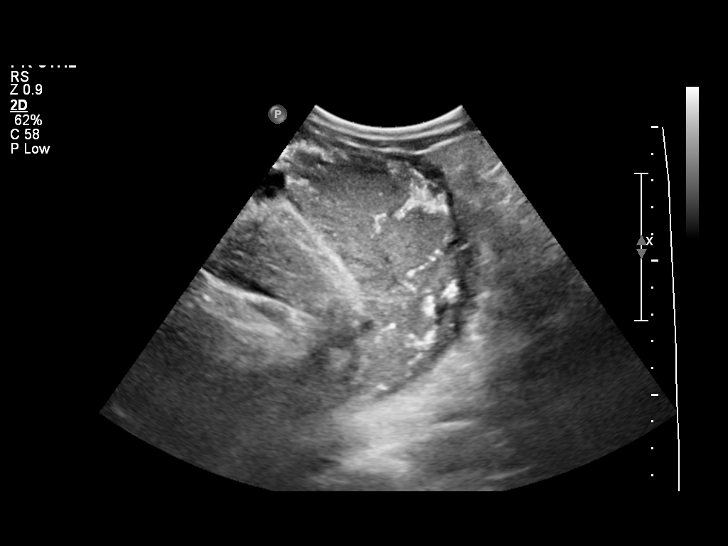
[im 19/23]
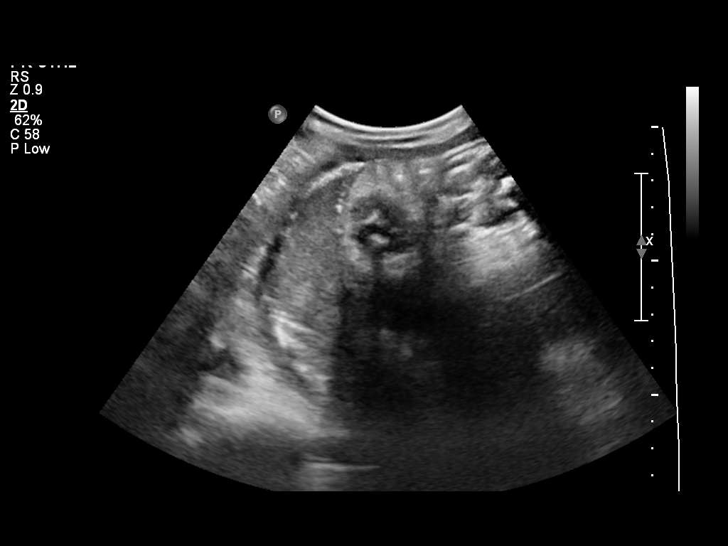
[im 21/23]
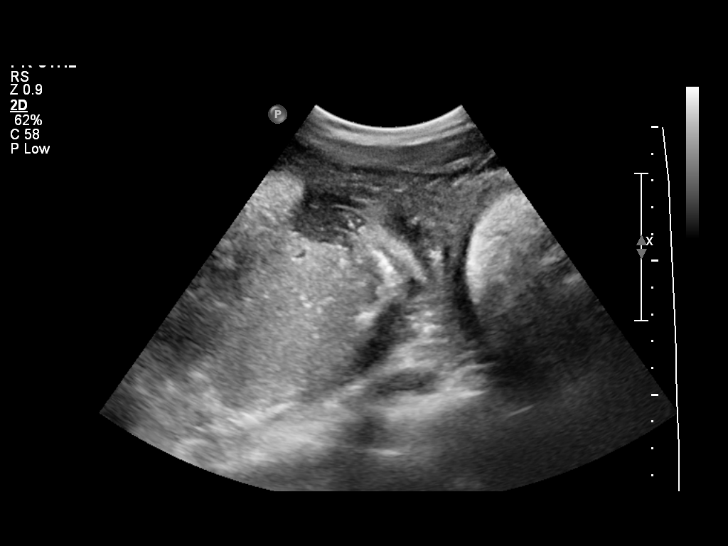
[im 23/23]
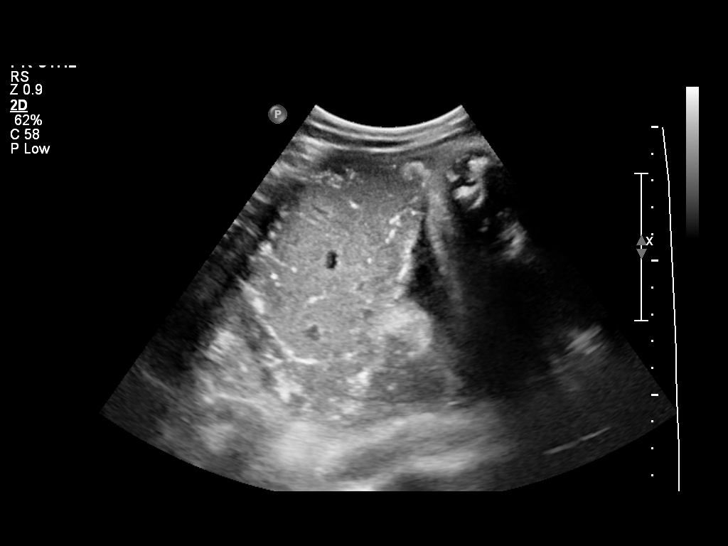

[14 of 23 positions shown; findings below may reference images not displayed]

OBSTETRICS REPORT
                      (Signed Final 06/17/2011 [DATE])

Procedures

Indications

 Non-reactive NST, FHR variables
 Postdate pregnancy (40-42 weeks)
Fetal Evaluation

 Fetal Heart Rate:  133                          bpm
 Cardiac Activity:  Observed
 Presentation:      Cephalic
 Placenta:          Fundal, above cervical os
 P. Cord Insertion: Not well visualized

 Amniotic Fluid
 AFI FV:      Subjectively low-normal
 AFI Sum:     8.11    cm       16  %Tile      Larg Pckt:    3.6  cm
 RUQ:   0.86    cm   RLQ:    1.48   cm    LUQ:   2.17    cm   LLQ:    3.6    cm
Biophysical Evaluation

 Amniotic F.V:   Within normal limits       F. Tone:         Observed
 F. Movement:    Observed                   Score:           [DATE]
 F. Breathing:   Observed
Gestational Age

 LMP:           41w 0d        Date:  09/03/10                 EDD:   06/10/11
 Best:          41w 0d     Det. By:  LMP  (09/03/10)          EDD:   06/10/11
Impression

 Biophysical profile score of [DATE].

 questions or concerns.

## 2014-07-19 ENCOUNTER — Encounter (HOSPITAL_COMMUNITY): Payer: Self-pay | Admitting: *Deleted

## 2014-08-11 ENCOUNTER — Encounter: Payer: Self-pay | Admitting: Advanced Practice Midwife

## 2014-08-11 ENCOUNTER — Ambulatory Visit (INDEPENDENT_AMBULATORY_CARE_PROVIDER_SITE_OTHER): Payer: BC Managed Care – PPO | Admitting: Advanced Practice Midwife

## 2014-08-11 VITALS — BP 130/82 | Ht 63.0 in | Wt 146.0 lb

## 2014-08-11 DIAGNOSIS — Z308 Encounter for other contraceptive management: Secondary | ICD-10-CM

## 2014-08-11 DIAGNOSIS — Z3046 Encounter for surveillance of implantable subdermal contraceptive: Secondary | ICD-10-CM

## 2014-08-11 DIAGNOSIS — Z3202 Encounter for pregnancy test, result negative: Secondary | ICD-10-CM

## 2014-08-11 LAB — POCT URINE PREGNANCY: Preg Test, Ur: NEGATIVE

## 2014-08-11 MED ORDER — NORGESTIMATE-ETH ESTRADIOL 0.25-35 MG-MCG PO TABS: 1.0000 | ORAL_TABLET | Freq: Every day | ORAL | Status: AC

## 2014-08-11 NOTE — Progress Notes (Signed)
HPI:  Becky Sharp 23 y.o. here for Nexplanon removal.  Her future plans for birth control are COC's. (Dr. Despina HiddenEure and she bet a pizza that she would get pregnant within 1 year). .  Past Medical History: Past Medical History  Diagnosis Date  . No pertinent past medical history     Past Surgical History: Past Surgical History  Procedure Laterality Date  . No past surgeries      Family History: Family History  Problem Relation Age of Onset  . Hypertension Mother   . Arthritis Maternal Grandmother   . Diabetes Maternal Grandmother   . Vision loss Maternal Grandmother   . Arthritis Paternal Grandmother   . Diabetes Paternal Grandmother   . Hyperlipidemia Paternal Grandmother   . Asthma Paternal Grandfather   . Diabetes Paternal Grandfather   . Heart disease Paternal Grandfather   . Stroke Paternal Grandfather     Social History: History  Substance Use Topics  . Smoking status: Never Smoker   . Smokeless tobacco: Never Used  . Alcohol Use: No    Allergies: No Known Allergies    Patient given informed consent for removal of her Nexplanon, time out was performed.  Signed copy in the chart.  Appropriate time out taken. Implanon site identified. The proximal end is actually in the axilla. Because of concern for nerve/artery involvement, Dr. Despina HiddenEure was asked to remove it.  Area prepped in usual sterile fashon. One cc of 1% lidocaine was used to anesthetize the area at the distal end of the implant. A small stab incision was made right beside the implant on the distal portion.  The Nexplanon rod was grasped using hemostats and removed without difficulty.  There was less than 3 cc blood loss. There were no complications.  A small amount of antibiotic ointment and steri-strips were applied over the small incision.  A pressure bandage was applied to reduce any bruising.  The patient tolerated the procedure well and was given post procedure instructions.

## 2015-06-09 ENCOUNTER — Encounter (HOSPITAL_COMMUNITY): Payer: Self-pay | Admitting: *Deleted

## 2015-06-09 ENCOUNTER — Emergency Department (HOSPITAL_COMMUNITY)
Admission: EM | Admit: 2015-06-09 | Discharge: 2015-06-10 | Disposition: A | Discharge status: Home / Self Care | Payer: 59 | Attending: Emergency Medicine | Admitting: Emergency Medicine

## 2015-06-09 DIAGNOSIS — R61 Generalized hyperhidrosis: Secondary | ICD-10-CM | POA: Insufficient documentation

## 2015-06-09 DIAGNOSIS — R42 Dizziness and giddiness: Secondary | ICD-10-CM | POA: Insufficient documentation

## 2015-06-09 DIAGNOSIS — Z3A08 8 weeks gestation of pregnancy: Secondary | ICD-10-CM | POA: Insufficient documentation

## 2015-06-09 DIAGNOSIS — O26899 Other specified pregnancy related conditions, unspecified trimester: Secondary | ICD-10-CM

## 2015-06-09 DIAGNOSIS — O9989 Other specified diseases and conditions complicating pregnancy, childbirth and the puerperium: Secondary | ICD-10-CM | POA: Insufficient documentation

## 2015-06-09 DIAGNOSIS — H538 Other visual disturbances: Secondary | ICD-10-CM | POA: Insufficient documentation

## 2015-06-09 DIAGNOSIS — Z793 Long term (current) use of hormonal contraceptives: Secondary | ICD-10-CM | POA: Diagnosis not present

## 2015-06-09 DIAGNOSIS — O99351 Diseases of the nervous system complicating pregnancy, first trimester: Secondary | ICD-10-CM | POA: Insufficient documentation

## 2015-06-09 DIAGNOSIS — R6883 Chills (without fever): Secondary | ICD-10-CM | POA: Insufficient documentation

## 2015-06-09 DIAGNOSIS — R1033 Periumbilical pain: Secondary | ICD-10-CM | POA: Insufficient documentation

## 2015-06-09 DIAGNOSIS — Z79899 Other long term (current) drug therapy: Secondary | ICD-10-CM | POA: Diagnosis not present

## 2015-06-09 DIAGNOSIS — R109 Unspecified abdominal pain: Secondary | ICD-10-CM

## 2015-06-09 DIAGNOSIS — Z3491 Encounter for supervision of normal pregnancy, unspecified, first trimester: Secondary | ICD-10-CM

## 2015-06-09 LAB — PREGNANCY, URINE: Preg Test, Ur: POSITIVE — AB

## 2015-06-09 MED ORDER — OXYCODONE-ACETAMINOPHEN 5-325 MG PO TABS: 1.0000 | ORAL_TABLET | Freq: Once | ORAL | Status: DC

## 2015-06-09 MED ORDER — IBUPROFEN 400 MG PO TABS: 600.0000 mg | ORAL_TABLET | Freq: Once | ORAL | Status: DC

## 2015-06-09 NOTE — ED Notes (Signed)
Pt reporting lower abdominal pain since Friday.  States that she has had some nausea, and mild pain with urination.

## 2015-06-09 NOTE — ED Provider Notes (Signed)
CSN: [id number]     Arrival date & time 06/09/15  2254 History  This chart was scribed for Raeford Razor, MD by Budd Palmer, ED Scribe. This patient was seen in room APA14/APA14 and the patient's care was started at 11:43 PM.    Chief Complaint  Patient presents with  . Abdominal Pain   The history is provided by the patient. No language interpreter was used.   HPI Comments: Becky Sharp is a 24 y.o. female who presents to the Emergency Department complaining of intermittent, sore, bilateral lower abdominal pain onset 6 days ago. Pt notes the pain was getting better until tonight, when it began to worsen once more. She describes the pain as feeling as though she was full of gas. She reports associated chills, diaphoresis, visual disturbances ("yellowing" of vision), and lightheadedness. She has taken ibuprofen for pain with no relief. She notes a similar previous episode in March, which resolved on its own. Her LNMP was at the end of July. She states her period has been erratic since her Implanon was removed in November 2015. She denies a PSHx of the abdomen. Pt denies dysuria, vaginal bleeding or discharge. She has NKDA.  Past Medical History  Diagnosis Date  . No pertinent past medical history    Past Surgical History  Procedure Laterality Date  . No past surgeries     Family History  Problem Relation Age of Onset  . Hypertension Mother   . Arthritis Maternal Grandmother   . Diabetes Maternal Grandmother   . Vision loss Maternal Grandmother   . Arthritis Paternal Grandmother   . Diabetes Paternal Grandmother   . Hyperlipidemia Paternal Grandmother   . Asthma Paternal Grandfather   . Diabetes Paternal Grandfather   . Heart disease Paternal Grandfather   . Stroke Paternal Grandfather    Social History  Substance Use Topics  . Smoking status: Never Smoker   . Smokeless tobacco: Never Used  . Alcohol Use: No   OB History    Gravida Para Term Preterm AB TAB SAB Ectopic  Multiple Living   Review of Systems  Constitutional: Positive for chills and diaphoresis.  Eyes: Positive for visual disturbance.  Gastrointestinal: Positive for abdominal pain.  Genitourinary: Negative for dysuria, vaginal bleeding and vaginal discharge.  Neurological: Positive for light-headedness.  All other systems reviewed and are negative.   Allergies  Review of patient's allergies indicates no known allergies.  Home Medications   Prior to Admission medications   Medication Sig Start Date End Date Taking? Authorizing Provider  Calcium Carbonate Antacid (TUMS PO) Take 1 tablet by mouth daily.      Historical Provider, MD  megestrol (MEGACE) 40 MG tablet Take 1 tablet (40 mg total) by mouth daily. 04/02/13   Lazaro Arms, MD  norgestimate-ethinyl estradiol (ORTHO-CYCLEN,SPRINTEC,PREVIFEM) 0.25-35 MG-MCG tablet Take 1 tablet by mouth daily. 08/11/14   Jacklyn Shell, CNM  prenatal vitamin w/FE, FA (PRENATAL 1 + 1) 27-1 MG TABS Take 1 tablet by mouth daily.      Historical Provider, MD   BP 97/62 mmHg  Pulse 60  Resp 20  Ht  (1.626 m)  Wt 154 lb (69.854 kg)  BMI 26.42 kg/m2  SpO2 100%  LMP 04/08/2015 Physical Exam  Constitutional: She is oriented to person, place, and time. She appears well-developed and well-nourished.  HENT:  Head: Normocephalic.  Eyes: EOM are normal.  Neck: Normal range of  motion.  Pulmonary/Chest: Effort normal.  Abdominal: She exhibits no distension. There is tenderness. There is no rebound and no guarding.  Mild periumbilical TTP  Musculoskeletal: Normal range of motion.  Neurological: She is alert and oriented to person, place, and time.  Psychiatric: She has a normal mood and affect.  Nursing note and vitals reviewed.   ED Course  Procedures  DIAGNOSTIC STUDIES: Oxygen Saturation is 100% on RA, normal by my interpretation.    COORDINATION OF CARE: 11:48 PM - Discussed plans to order diagnostic studies  and pain medication. Pt advised of plan for treatment and pt agrees.  Labs Review Labs Reviewed  URINALYSIS, ROUTINE W REFLEX MICROSCOPIC (NOT AT [facility]) - Abnormal; Notable for the following:    Color, Urine AMBER (*)    APPearance HAZY (*)    Specific Gravity, Urine >1.030 (*)    Glucose, UA 100 (*)    Hgb urine dipstick TRACE (*)    Bilirubin Urine MODERATE (*)    Ketones, ur TRACE (*)    Protein, ur 100 (*)    Urobilinogen, UA 2.0 (*)    Leukocytes, UA TRACE (*)    All other components within normal limits  PREGNANCY, URINE - Abnormal; Notable for the following:    Preg Test, Ur POSITIVE (*)    All other components within normal limits  URINE MICROSCOPIC-ADD ON - Abnormal; Notable for the following:    Squamous Epithelial / LPF MANY (*)    Bacteria, UA MANY (*)    All other components within normal limits  URINE CULTURE    Imaging Review No results found. I have personally reviewed and evaluated these images and lab results as part of my medical decision-making.   EKG Interpretation None      MDM   Final diagnoses:  First trimester pregnancy  Abdominal pain in pregnancy    24yF abdominal pain in pregnancy. Exam with minimal. Umbilical tenderness. Low suspicion for for acute surgical process or emergence complication of pregnancy. Bacteria noted on UA although many squamous cells. Will treat. OB follow-up.  I personally preformed the services scribed in my presence. The recorded information has been reviewed is accurate. Raeford Razor, MD.   Raeford Razor, MD 06/18/15 [phone]

## 2015-06-10 LAB — URINALYSIS, ROUTINE W REFLEX MICROSCOPIC
Glucose, UA: 100 mg/dL — AB
Nitrite: NEGATIVE
Protein, ur: 100 mg/dL — AB
Specific Gravity, Urine: >1.03 — ABNORMAL HIGH (ref 1.005–1.030)
Urobilinogen, UA: 2 mg/dL — ABNORMAL HIGH (ref 0.0–1.0)
pH: 6 (ref 5.0–8.0)

## 2015-06-10 LAB — URINE MICROSCOPIC-ADD ON

## 2015-06-10 MED ORDER — ONDANSETRON HCL 4 MG PO TABS
4.0000 mg | ORAL_TABLET | Freq: Four times a day (QID) | ORAL | Status: AC
Start: 2015-06-10 — End: ?

## 2015-06-10 MED ORDER — CEPHALEXIN 500 MG PO CAPS
500.0000 mg | ORAL_CAPSULE | Freq: Once | ORAL | Status: AC
Start: 2015-06-10 — End: 2015-06-10
  Administered 2015-06-10: 500 mg via ORAL
  Filled 2015-06-10: qty 1

## 2015-06-10 MED ORDER — ACETAMINOPHEN 325 MG PO TABS
650.0000 mg | ORAL_TABLET | Freq: Once | ORAL | Status: AC
  Administered 2015-06-10: 650 mg via ORAL
  Filled 2015-06-10: qty 2

## 2015-06-10 MED ORDER — CEPHALEXIN 500 MG PO CAPS: 500.0000 mg | ORAL_CAPSULE | Freq: Four times a day (QID) | ORAL | Status: AC

## 2015-06-10 NOTE — Discharge Instructions (Signed)
Abdominal Pain During Pregnancy °Abdominal pain is common in pregnancy. Most of the time, it does not cause harm. There are many causes of abdominal pain. Some causes are more serious than others. Some of the causes of abdominal pain in pregnancy are easily diagnosed. Occasionally, the diagnosis takes time to understand. Other times, the cause is not determined. Abdominal pain can be a sign that something is very wrong with the pregnancy, or the pain may have nothing to do with the pregnancy at all. For this reason, always tell your health care provider if you have any abdominal discomfort. °HOME CARE INSTRUCTIONS  °Monitor your abdominal pain for any changes. The following actions may help to alleviate any discomfort you are experiencing: °· Do not have sexual intercourse or put anything in your vagina until your symptoms go away completely. °· Get plenty of rest until your pain improves. °· Drink clear fluids if you feel nauseous. Avoid solid food as long as you are uncomfortable or nauseous. °· Only take over-the-counter or prescription medicine as directed by your health care provider. °· Keep all follow-up appointments with your health care provider. °SEEK IMMEDIATE MEDICAL CARE IF: °· You are bleeding, leaking fluid, or passing tissue from the vagina. °· You have increasing pain or cramping. °· You have persistent vomiting. °· You have painful or bloody urination. °· You have a fever. °· You notice a decrease in your baby's movements. °· You have extreme weakness or feel faint. °· You have shortness of breath, with or without abdominal pain. °· You develop a severe headache with abdominal pain. °· You have abnormal vaginal discharge with abdominal pain. °· You have persistent diarrhea. °· You have abdominal pain that continues even after rest, or gets worse. °MAKE SURE YOU:  °· Understand these instructions. °· Will watch your condition. °· Will get help right away if you are not doing well or get  worse. °Document Released: 09/03/2005 Document Revised: 06/24/2013 Document Reviewed: 04/02/2013 °ExitCare® Patient Information ©2015 [employer]. This information is not intended to replace advice given to you by your health care provider. Make sure you discuss any questions you have with your health care provider. ° ° °Emergency Department Resource Guide °1) Find a Doctor and Pay Out of Pocket °Although you won't have to find out who is covered by your insurance plan, it is a good idea to ask around and get recommendations. You will then need to call the office and see if the doctor you have chosen will accept you as a new patient and what types of options they offer for patients who are self-pay. Some doctors offer discounts or will set up payment plans for their patients who do not have insurance, but you will need to ask so you aren't surprised when you get to your appointment. ° °2) Contact Your Local Health Department °Not all health departments have doctors that can see patients for sick visits, but many do, so it is worth a call to see if yours does. If you don't know where your local health department is, you can check in your phone book. The CDC also has a tool to help you locate your state's health department, and many state websites also have listings of all of their local health departments. ° °3) Find a Walk-in Clinic °If your illness is not likely to be very severe or complicated, you may want to try a walk in clinic. These are popping up all over the country in pharmacies, drugstores, and shopping   centers. They're usually staffed by nurse practitioners or physician assistants that have been trained to treat common illnesses and complaints. They're usually fairly quick and inexpensive. However, if you have serious medical issues or chronic medical problems, these are probably not your best option. ° °No Primary Care Doctor: °- Call [facility] Connect at  832-8000 - they can help you locate a primary  care doctor that  accepts your insurance, provides certain services, etc. °- Physician Referral Service- 1-800-533-3463 ° °Chronic Pain Problems: °Organization         Address  Phone   Notes  °[facility]  (336) 297-2271 Patients need to be referred by their primary care doctor.  ° °Medication Assistance: °Organization         Address  Phone   Notes  °[place] [facility] [address] °[place], [place] [address] (336) 641-8030 --Must be a resident of [place] °-- Must have NO insurance coverage whatsoever (no Medicaid/ Medicare, etc.) °-- The pt. MUST have a primary care doctor that directs their care regularly and follows them in the community °  °[facility]  (866) 331-1348   °[employer]  (888) 892-1162   ° °Agencies that provide inexpensive medical care: °Organization         Address  Phone   Notes  °[facility]  (336) 832-8035   °[facility]    (336) 832-7272   °[facility] [address] °[place], [place] [address] (336) 832-4777   °[facility] [address], °[place] (336) 271-4999   °[facility]    (336) 373-0678   °[facility]    (336) 272-1050   °[facility] ° [address], [place] Phone:  (336) 832-4444, Fax:  (336) 832-4440 Hours of Operation:  9 am - 6 pm, M-F.  Also accepts Medicaid/Medicare and self-pay.  °[facility] ° [address], [place] Phone: (336) 832-3150, Fax: (336) [phone]. Hours of Operation:  8:30 am - 5:30 pm, M-F.  Also accepts Medicaid and self-pay.  °[facility] [place] [address], [place] Phone: (336) 878-6027   °[facility] [address], [place], [place] (336)723-1848, Ext. 123 Mondays & Thursdays: 7-9 AM.  First 15 patients are seen on a first come, first serve basis. °  ° °Medicaid-accepting [place]  Providers: ° °Organization         Address  Phone   Notes  °[facility] [address], [place] (336) 641-2100 Also accepts self-pay patients.  °[facility] [address], [place] ° (336) 856-9996   °[facility] [address], [place] (336) 288-8857   °[facility] [address] [place] [address], [place] (336) 299-7000   °Veita Bland [address],   ° (336) 373-1557 Only accepts [place] Access Medicaid patients after they have their name applied to their card.  ° °Self-Pay (no insurance) in [place]: ° °Organization         Address  Phone   Notes  °Sickle Cell Patients, [facility] [address],  (336) 832-1970   °[facility] [address],  (336) 832-4400   °[facility] ° [address], [place] (336) 992-4800   °  [facility]/Dr. Osei-Bonsu ° [address] [place] [address], [place] or [address], [place] (336) [phone] Phone number for both [place] and [place] locations is the same.  °[facility] [address], [place] (336) 299-0000   °[facility] [address] [place] [address], [place] or [address] (336) 852-7530 °(336) 878-2260   °[facility] [address], [place] (336) 350-1642, phone; (336) 294-5005, fax Sees patients 1st and 3rd Saturday of every month.  Must not qualify for public or private insurance (i.e. Medicaid, Medicare, [place] Health Choice, Veterans' Benefits) • Household income should be no more than 200% of the poverty level •The clinic cannot treat you if you are pregnant or think you are pregnant • Sexually transmitted diseases are not treated at the clinic.  ° ° °Dental Care: °Organization         Address  Phone  Notes  °[facility] [address], [place] (336) 641-6152 Accepts children up to age 21 who are enrolled in Medicaid or [place] Health Choice; pregnant women with a Medicaid card; and children who have applied for Medicaid or [place] Health Choice, but were declined, whose parents can pay a reduced fee at time of service.  °[facility] [place]  [address], [place] (336) 641-7733 Accepts children up to age 21 who are enrolled in Medicaid or [place] Health Choice; pregnant women with a Medicaid card; and children who have applied for Medicaid or [place] Health Choice, but were declined, whose parents can pay a reduced fee at time of service.  °[facility] ° [address], [place] (336) 641-4533 Patients are seen by appointment only. Walk-ins are not accepted. [facility] will see patients 18 years of age and older. °Monday - Tuesday (8am-5pm) °Most Wednesdays (8:30-5pm) °$30 per visit, cash only  °[facility] ° [address], [place] (336) 641-4533 Patients are seen by appointment only. Walk-ins are not accepted. [facility] will see patients 18 years of age and older. °One Wednesday Evening (Monthly: Volunteer Based).  $30 per visit, cash only  °[facility]  (919) 537-3737 for adults; Children under age 4, call Graduate Pediatric Dentistry at (919) [phone]. Children aged 4-14, please call (919) [phone] to request a pediatric application. ° Dental services are provided in all areas of dental care including fillings, crowns and bridges, complete and partial dentures, implants, gum treatment, root canals, and extractions. Preventive care is also provided. Treatment is provided to both adults and children. °Patients are selected via a lottery and there is often a waiting list. °  °[facility] [address], °[place] ° (336) 763-8833 [email or web address] °  °[facility]  [address], [place], [place] (336)723-1848, Ext. 123 Second and Fourth Thursday of each month, opens at 6:30 AM; Clinic ends at 9 AM.  Patients are seen on a first-come first-served basis, and a limited number are seen during each clinic.  ° °[facility] ° [address], [place],  (336) 723-7904   Eligibility Requirements °You must have lived in [place], [place], or [place] counties for at least the last three months. °  You cannot be eligible for state or federal sponsored healthcare insurance, including Veterans Administration, Medicaid, or Medicare. °  You generally cannot be eligible for healthcare insurance   through your employer.  °  How to apply: °Eligibility screenings are held every Tuesday and Wednesday afternoon from 1:00 pm until 4:00 pm. You do not need an appointment for the interview!  °[facility] [address], [place], [place] 336-631-2330   °[facility]  336-342-8273   °[facility]  336-703-3100   °[facility]  336-570-6415   ° °Behavioral Health Resources in the Community: °Intensive Outpatient Programs °Organization         Address  Phone  Notes  °[facility] [address], [place], [place] 336-878-6098   °[facility] [address], [place], [place] 336-832-9800   °[facility] [address], [place], [place] ° 336-882-2125   °[facility] [address],  °[place], [place] 1-800-853-5163 or 336-641-4981   °Substance Abuse Resources °Organization         Address  Phone  Notes  °[facility]  336-882-2125   °[facility]  336-784-9470   °[facility]  336-285-9073   °[facility]  336-845-3988   °Residential & Outpatient Substance Abuse Program  1-800-659-3381   °Psychological Services °Organization         Address  Phone  Notes  °[facility]  336- 832-9600    °[facility]  336- 378-7881   °[facility] [address], [place] 1-800-853-5163 or 336-641-4981   ° °Mobile Crisis Teams °Organization         Address  Phone  Notes  °[facility], Mobile Crisis Care Unit  1-877-626-1772   °Assertive °[facility] ° [address] [place], [place] 336-834-9664   °Sharon DeEsch [address] °[place] 336-554-5454   ° °Self-Help/Support Groups °Organization         Address  Phone             Notes  °[facility] - variety of support groups  336- 373-1402 Call for more information  °Narcotics Anonymous (NA), [facility] [address], °[place]  2 meetings at this location  ° °Residential Treatment Programs °Organization         Address  Phone  Notes  °[facility] [address],    °[place]  1-866-801-8205   °[facility] ° [address], [place], [place] 704-293-8524   °[facility] [address], [place] 336-845-3988 Admissions: 8am-3pm M-F  °[facility] [address]    °[place], [place] 336-841-1104   °[facility] [address], [place], [place] 336-379-7146   °[facility] [address]  °[place], [place] 336-285-9073   °[facility] - Intensive Outpatient [address], [place], [place] 336-852-3033   °[facility] [address]  °[place], [place] 1-877-615-2722 or 336-784-9470   °[facility] [address] [place], [place] 336-227-7417 Accepts Medicaid  °[facility] [address]  °[place] 1-800-659-3381 Substance Abuse/Addiction Treatment  ° °[place] Behavioral Health Resources °Organization         Address  Phone  Notes  °[facility]  (888) 581-9988   °Julie Brannon, PhD [address] [place], [place]   (336) 349-5553 or (336) 951-0000    °[facility]   [address] °[place], [place] (336) 349-4454   °  [facility] [address], [place], [place] (336) 342-8316 Insurance/Medicaid/sponsorship through [facility]  °[facility] [address]                                    [place], [place] (336) 342-8316 Therapy/tele-psych/case  °[facility] [address]  ° [place], [place] (336) 349-2233    °Dr. Arfeen  (336) 349-4544   °[facility] [place]  [employer] [place] [facility] 1) [address], [place] °2) [address], [place] °3)  [address], [place] (336) 349-3220 °(336) 342-7768 ° °(336) 342-8140   °[place] Child Abuse Hotline (336) 342-1394 or (336) 342-3537 (After Hours)    ° ° ° °

## 2015-06-11 LAB — URINE CULTURE

## 2020-01-16 ENCOUNTER — Ambulatory Visit: Payer: Self-pay | Attending: Internal Medicine

## 2020-01-16 DIAGNOSIS — Z23 Encounter for immunization: Secondary | ICD-10-CM

## 2020-01-16 NOTE — Progress Notes (Signed)
   Cov[facility] Vaccination [facility]  Name:  Becky Sharp    MRN: [id number] DOB: [birth year]  01/16/2020  [name] was observed post Covid-19 immunization for 15 minutes without incident. She was provided with Vaccine Information Sheet and instruction to access the V-Safe system.   [name] was instructed to call 911 with any severe reactions post vaccine: Marland Kitchen Difficulty breathing  . Swelling of face and throat  . A fast heartbeat  . A bad rash all over body  . Dizziness and weakness   Immunizations Administered    Name Date Dose VIS Date Route   Moderna COVID-19 Vaccine 01/16/2020 11:10 AM 0.5 mL 08/2019 Intramuscular   Manufacturer: Moderna   Lot: [id number]   NDC: 18590-931-12

## 2020-02-20 ENCOUNTER — Ambulatory Visit: Payer: Self-pay

## 2020-02-20 DIAGNOSIS — Z23 Encounter for immunization: Secondary | ICD-10-CM

## 2020-02-20 NOTE — Progress Notes (Signed)
   Covid-19 Vaccination Clinic  Name:  Leonor Darnell    MRN: [id number] DOB: 22-Apr-1991  02/20/2020  Ms. Steege was observed post Covid-19 immunization for 15 minutes without incident. She was provided with Vaccine Information Sheet and instruction to access the V-Safe system.   Ms. Smoak was instructed to call 911 with any severe reactions post vaccine: Marland Kitchen Difficulty breathing  . Swelling of face and throat  . A fast heartbeat  . A bad rash all over body  . Dizziness and weakness   Immunizations Administered    Name Date Dose VIS Date Route   Moderna COVID-19 Vaccine 02/20/2020  9:54 AM 0.5 mL 08/2019 Intramuscular   Manufacturer: Moderna   Lot: [id number]   NDC: 54832-346-88
